# Patient Record
Sex: Female | Born: 1935
Health system: Southern US, Community
[De-identification: ages and names within clinical notes are randomized; demographics above are authoritative.]

---

## 1998-01-19 ENCOUNTER — Inpatient Hospital Stay (HOSPITAL_COMMUNITY): Admission: EM | Admit: 1998-01-19 | Discharge: 1998-01-21 | Payer: Self-pay | Admitting: Emergency Medicine

## 1998-01-21 ENCOUNTER — Inpatient Hospital Stay (HOSPITAL_COMMUNITY): Admission: EM | Admit: 1998-01-21 | Discharge: 1998-01-25 | Payer: Self-pay | Admitting: Emergency Medicine

## 1999-08-21 ENCOUNTER — Other Ambulatory Visit: Admission: RE | Admit: 1999-08-21 | Discharge: 1999-08-21 | Payer: Self-pay | Admitting: *Deleted

## 1999-09-26 ENCOUNTER — Encounter (INDEPENDENT_AMBULATORY_CARE_PROVIDER_SITE_OTHER): Payer: Self-pay

## 1999-09-26 ENCOUNTER — Other Ambulatory Visit: Admission: RE | Admit: 1999-09-26 | Discharge: 1999-09-26 | Payer: Self-pay | Admitting: *Deleted

## 2000-08-18 ENCOUNTER — Other Ambulatory Visit: Admission: RE | Admit: 2000-08-18 | Discharge: 2000-08-18 | Payer: Self-pay | Admitting: *Deleted

## 2002-08-22 ENCOUNTER — Other Ambulatory Visit: Admission: RE | Admit: 2002-08-22 | Discharge: 2002-08-22 | Payer: Self-pay | Admitting: Obstetrics and Gynecology

## 2004-01-25 ENCOUNTER — Other Ambulatory Visit: Admission: RE | Admit: 2004-01-25 | Discharge: 2004-01-25 | Payer: Self-pay | Admitting: Obstetrics and Gynecology

## 2008-03-10 ENCOUNTER — Encounter: Admission: RE | Admit: 2008-03-10 | Discharge: 2008-03-10 | Payer: Self-pay | Admitting: Internal Medicine

## 2012-01-28 DIAGNOSIS — H5710 Ocular pain, unspecified eye: Secondary | ICD-10-CM | POA: Diagnosis not present

## 2012-01-28 DIAGNOSIS — H5789 Other specified disorders of eye and adnexa: Secondary | ICD-10-CM | POA: Diagnosis not present

## 2012-01-28 DIAGNOSIS — H02059 Trichiasis without entropian unspecified eye, unspecified eyelid: Secondary | ICD-10-CM | POA: Diagnosis not present

## 2012-01-28 DIAGNOSIS — H179 Unspecified corneal scar and opacity: Secondary | ICD-10-CM | POA: Diagnosis not present

## 2012-02-11 DIAGNOSIS — Z79899 Other long term (current) drug therapy: Secondary | ICD-10-CM | POA: Diagnosis not present

## 2012-02-11 DIAGNOSIS — M899 Disorder of bone, unspecified: Secondary | ICD-10-CM | POA: Diagnosis not present

## 2012-02-11 DIAGNOSIS — Z0000 Encounter for general adult medical examination without abnormal findings: Secondary | ICD-10-CM | POA: Diagnosis not present

## 2012-02-11 DIAGNOSIS — J329 Chronic sinusitis, unspecified: Secondary | ICD-10-CM | POA: Diagnosis not present

## 2012-02-11 DIAGNOSIS — K5730 Diverticulosis of large intestine without perforation or abscess without bleeding: Secondary | ICD-10-CM | POA: Diagnosis not present

## 2012-02-11 DIAGNOSIS — Z1331 Encounter for screening for depression: Secondary | ICD-10-CM | POA: Diagnosis not present

## 2012-04-07 DIAGNOSIS — R222 Localized swelling, mass and lump, trunk: Secondary | ICD-10-CM | POA: Diagnosis not present

## 2012-04-08 ENCOUNTER — Other Ambulatory Visit: Payer: Self-pay | Admitting: *Deleted

## 2012-04-08 DIAGNOSIS — R911 Solitary pulmonary nodule: Secondary | ICD-10-CM

## 2012-04-13 ENCOUNTER — Ambulatory Visit
Admission: RE | Admit: 2012-04-13 | Discharge: 2012-04-13 | Disposition: A | Discharge status: Home / Self Care | Payer: Medicare Other | Source: Ambulatory Visit | Attending: *Deleted | Admitting: *Deleted

## 2012-04-13 DIAGNOSIS — J984 Other disorders of lung: Secondary | ICD-10-CM | POA: Diagnosis not present

## 2012-04-13 DIAGNOSIS — R911 Solitary pulmonary nodule: Secondary | ICD-10-CM

## 2012-04-14 ENCOUNTER — Other Ambulatory Visit: Payer: Self-pay | Admitting: Internal Medicine

## 2012-04-14 DIAGNOSIS — R918 Other nonspecific abnormal finding of lung field: Secondary | ICD-10-CM

## 2012-05-27 DIAGNOSIS — Z961 Presence of intraocular lens: Secondary | ICD-10-CM | POA: Diagnosis not present

## 2012-05-27 DIAGNOSIS — H5789 Other specified disorders of eye and adnexa: Secondary | ICD-10-CM | POA: Diagnosis not present

## 2012-05-27 DIAGNOSIS — H00039 Abscess of eyelid unspecified eye, unspecified eyelid: Secondary | ICD-10-CM | POA: Diagnosis not present

## 2012-05-27 DIAGNOSIS — H43399 Other vitreous opacities, unspecified eye: Secondary | ICD-10-CM | POA: Diagnosis not present

## 2012-08-05 DIAGNOSIS — H04129 Dry eye syndrome of unspecified lacrimal gland: Secondary | ICD-10-CM | POA: Diagnosis not present

## 2012-08-05 DIAGNOSIS — H43399 Other vitreous opacities, unspecified eye: Secondary | ICD-10-CM | POA: Diagnosis not present

## 2012-08-05 DIAGNOSIS — Z961 Presence of intraocular lens: Secondary | ICD-10-CM | POA: Diagnosis not present

## 2012-09-02 DIAGNOSIS — Z1231 Encounter for screening mammogram for malignant neoplasm of breast: Secondary | ICD-10-CM | POA: Diagnosis not present

## 2012-09-02 DIAGNOSIS — J069 Acute upper respiratory infection, unspecified: Secondary | ICD-10-CM | POA: Diagnosis not present

## 2012-10-11 DIAGNOSIS — M76829 Posterior tibial tendinitis, unspecified leg: Secondary | ICD-10-CM | POA: Diagnosis not present

## 2012-10-11 DIAGNOSIS — M25473 Effusion, unspecified ankle: Secondary | ICD-10-CM | POA: Diagnosis not present

## 2012-10-11 DIAGNOSIS — M25476 Effusion, unspecified foot: Secondary | ICD-10-CM | POA: Diagnosis not present

## 2012-10-11 DIAGNOSIS — M2010 Hallux valgus (acquired), unspecified foot: Secondary | ICD-10-CM | POA: Diagnosis not present

## 2012-10-11 DIAGNOSIS — M79609 Pain in unspecified limb: Secondary | ICD-10-CM | POA: Diagnosis not present

## 2012-10-14 ENCOUNTER — Ambulatory Visit
Admission: RE | Admit: 2012-10-14 | Discharge: 2012-10-14 | Disposition: A | Discharge status: Home / Self Care | Payer: PRIVATE HEALTH INSURANCE | Source: Ambulatory Visit | Attending: Internal Medicine | Admitting: Internal Medicine

## 2012-10-14 DIAGNOSIS — J988 Other specified respiratory disorders: Secondary | ICD-10-CM | POA: Diagnosis not present

## 2012-10-14 DIAGNOSIS — R918 Other nonspecific abnormal finding of lung field: Secondary | ICD-10-CM

## 2012-11-09 DIAGNOSIS — H16149 Punctate keratitis, unspecified eye: Secondary | ICD-10-CM | POA: Diagnosis not present

## 2012-11-09 DIAGNOSIS — T1510XA Foreign body in conjunctival sac, unspecified eye, initial encounter: Secondary | ICD-10-CM | POA: Diagnosis not present

## 2012-12-07 DIAGNOSIS — Q828 Other specified congenital malformations of skin: Secondary | ICD-10-CM | POA: Diagnosis not present

## 2012-12-07 DIAGNOSIS — M79609 Pain in unspecified limb: Secondary | ICD-10-CM | POA: Diagnosis not present

## 2012-12-07 DIAGNOSIS — L923 Foreign body granuloma of the skin and subcutaneous tissue: Secondary | ICD-10-CM | POA: Diagnosis not present

## 2013-02-16 DIAGNOSIS — M899 Disorder of bone, unspecified: Secondary | ICD-10-CM | POA: Diagnosis not present

## 2013-02-16 DIAGNOSIS — R911 Solitary pulmonary nodule: Secondary | ICD-10-CM | POA: Diagnosis not present

## 2013-02-16 DIAGNOSIS — Z1331 Encounter for screening for depression: Secondary | ICD-10-CM | POA: Diagnosis not present

## 2013-02-16 DIAGNOSIS — K5730 Diverticulosis of large intestine without perforation or abscess without bleeding: Secondary | ICD-10-CM | POA: Diagnosis not present

## 2013-02-16 DIAGNOSIS — Z0000 Encounter for general adult medical examination without abnormal findings: Secondary | ICD-10-CM | POA: Diagnosis not present

## 2013-02-16 DIAGNOSIS — M949 Disorder of cartilage, unspecified: Secondary | ICD-10-CM | POA: Diagnosis not present

## 2013-02-16 DIAGNOSIS — Z79899 Other long term (current) drug therapy: Secondary | ICD-10-CM | POA: Diagnosis not present

## 2013-03-30 DIAGNOSIS — L820 Inflamed seborrheic keratosis: Secondary | ICD-10-CM | POA: Diagnosis not present

## 2013-03-30 DIAGNOSIS — L821 Other seborrheic keratosis: Secondary | ICD-10-CM | POA: Diagnosis not present

## 2013-03-30 DIAGNOSIS — D239 Other benign neoplasm of skin, unspecified: Secondary | ICD-10-CM | POA: Diagnosis not present

## 2013-03-30 DIAGNOSIS — L708 Other acne: Secondary | ICD-10-CM | POA: Diagnosis not present

## 2013-07-25 DIAGNOSIS — L723 Sebaceous cyst: Secondary | ICD-10-CM | POA: Diagnosis not present

## 2013-07-25 DIAGNOSIS — I781 Nevus, non-neoplastic: Secondary | ICD-10-CM | POA: Diagnosis not present

## 2013-07-25 DIAGNOSIS — L821 Other seborrheic keratosis: Secondary | ICD-10-CM | POA: Diagnosis not present

## 2013-08-10 DIAGNOSIS — H35369 Drusen (degenerative) of macula, unspecified eye: Secondary | ICD-10-CM | POA: Diagnosis not present

## 2013-08-10 DIAGNOSIS — H43819 Vitreous degeneration, unspecified eye: Secondary | ICD-10-CM | POA: Diagnosis not present

## 2013-08-10 DIAGNOSIS — H35379 Puckering of macula, unspecified eye: Secondary | ICD-10-CM | POA: Diagnosis not present

## 2013-08-10 DIAGNOSIS — H02059 Trichiasis without entropian unspecified eye, unspecified eyelid: Secondary | ICD-10-CM | POA: Diagnosis not present

## 2013-08-10 DIAGNOSIS — H31019 Macula scars of posterior pole (postinflammatory) (post-traumatic), unspecified eye: Secondary | ICD-10-CM | POA: Diagnosis not present

## 2013-08-10 DIAGNOSIS — H10509 Unspecified blepharoconjunctivitis, unspecified eye: Secondary | ICD-10-CM | POA: Diagnosis not present

## 2013-09-01 DIAGNOSIS — Z23 Encounter for immunization: Secondary | ICD-10-CM | POA: Diagnosis not present

## 2013-09-05 DIAGNOSIS — Z1231 Encounter for screening mammogram for malignant neoplasm of breast: Secondary | ICD-10-CM | POA: Diagnosis not present

## 2013-11-17 ENCOUNTER — Other Ambulatory Visit: Payer: Self-pay | Admitting: Internal Medicine

## 2013-11-17 ENCOUNTER — Ambulatory Visit
Admission: RE | Admit: 2013-11-17 | Discharge: 2013-11-17 | Disposition: A | Discharge status: Home / Self Care | Payer: Medicare Other | Source: Ambulatory Visit | Attending: Internal Medicine | Admitting: Internal Medicine

## 2013-11-17 DIAGNOSIS — M79609 Pain in unspecified limb: Secondary | ICD-10-CM

## 2013-11-17 DIAGNOSIS — S8990XA Unspecified injury of unspecified lower leg, initial encounter: Secondary | ICD-10-CM | POA: Diagnosis not present

## 2013-11-17 DIAGNOSIS — S99919A Unspecified injury of unspecified ankle, initial encounter: Secondary | ICD-10-CM | POA: Diagnosis not present

## 2013-11-17 DIAGNOSIS — Z23 Encounter for immunization: Secondary | ICD-10-CM | POA: Diagnosis not present

## 2013-11-29 ENCOUNTER — Ambulatory Visit (INDEPENDENT_AMBULATORY_CARE_PROVIDER_SITE_OTHER): Payer: Medicare Other | Admitting: Podiatry

## 2013-11-29 ENCOUNTER — Encounter: Payer: Self-pay | Admitting: Podiatry

## 2013-11-29 VITALS — BP 119/84 | HR 75

## 2013-11-29 DIAGNOSIS — L923 Foreign body granuloma of the skin and subcutaneous tissue: Secondary | ICD-10-CM | POA: Diagnosis not present

## 2013-11-29 DIAGNOSIS — M79609 Pain in unspecified limb: Secondary | ICD-10-CM

## 2013-11-29 DIAGNOSIS — M79673 Pain in unspecified foot: Secondary | ICD-10-CM | POA: Insufficient documentation

## 2013-11-29 NOTE — Patient Instructions (Signed)
Removed foreign body, small fragment of glass. Return as needed.

## 2013-11-29 NOTE — Progress Notes (Signed)
Subjective: 80 year old female presents stating that she as a piece of glass on her foot after she broke a glass in wooden floor.  She is able to feel it and hurts at times, but could not get it out. She was seen by her PCP and x-rays were taken.   Objective: Black spot of lesion at ball of right foot. No associated edema or erythema noted.   Assessment: Foreign body right foot.  Plan: Area cleansed with Betadine solution and foreign body, a small fragment of glass removed. Area covered with Band aid.

## 2014-02-14 ENCOUNTER — Ambulatory Visit (INDEPENDENT_AMBULATORY_CARE_PROVIDER_SITE_OTHER): Payer: Medicare Other | Admitting: Podiatry

## 2014-02-14 ENCOUNTER — Encounter: Payer: Self-pay | Admitting: Podiatry

## 2014-02-14 VITALS — BP 132/80 | HR 71

## 2014-02-14 DIAGNOSIS — I83219 Varicose veins of right lower extremity with both ulcer of unspecified site and inflammation: Secondary | ICD-10-CM | POA: Diagnosis not present

## 2014-02-14 DIAGNOSIS — I83229 Varicose veins of left lower extremity with both ulcer of unspecified site and inflammation: Secondary | ICD-10-CM

## 2014-02-14 DIAGNOSIS — L97919 Non-pressure chronic ulcer of unspecified part of right lower leg with unspecified severity: Secondary | ICD-10-CM | POA: Diagnosis not present

## 2014-02-14 DIAGNOSIS — M79673 Pain in unspecified foot: Secondary | ICD-10-CM

## 2014-02-14 DIAGNOSIS — M79609 Pain in unspecified limb: Secondary | ICD-10-CM

## 2014-02-14 DIAGNOSIS — L97929 Non-pressure chronic ulcer of unspecified part of left lower leg with unspecified severity: Secondary | ICD-10-CM

## 2014-02-14 NOTE — Progress Notes (Signed)
Subjective: 80 year old female presents with painful area left foot dorsum, which had been hit with hard object about 3 weeks ago.  She noted of purple color and swelling.   Objective: Purple discoloration along dorsal branch of venous vein with peripheral swelling, which is  localized over 2nd and 3rd Metatarsal shaft area left foot with pain. No extended edema or erythema or color change.   Assessment: Ecchymosis following injury with enlarged venous vein dorsum of left foot.  Plan: Discussed findings. Compression stockinet dispensed with instruction.  Return as needed.

## 2014-02-14 NOTE — Patient Instructions (Signed)
Seen for pain in left foot. Need compression stockinet to protect bulged out vein. Return as needed.

## 2014-02-28 DIAGNOSIS — M899 Disorder of bone, unspecified: Secondary | ICD-10-CM | POA: Diagnosis not present

## 2014-02-28 DIAGNOSIS — Z79899 Other long term (current) drug therapy: Secondary | ICD-10-CM | POA: Diagnosis not present

## 2014-02-28 DIAGNOSIS — R911 Solitary pulmonary nodule: Secondary | ICD-10-CM | POA: Diagnosis not present

## 2014-02-28 DIAGNOSIS — Z0000 Encounter for general adult medical examination without abnormal findings: Secondary | ICD-10-CM | POA: Diagnosis not present

## 2014-02-28 DIAGNOSIS — H6120 Impacted cerumen, unspecified ear: Secondary | ICD-10-CM | POA: Diagnosis not present

## 2014-02-28 DIAGNOSIS — E782 Mixed hyperlipidemia: Secondary | ICD-10-CM | POA: Diagnosis not present

## 2014-02-28 DIAGNOSIS — Z1331 Encounter for screening for depression: Secondary | ICD-10-CM | POA: Diagnosis not present

## 2014-02-28 DIAGNOSIS — K5730 Diverticulosis of large intestine without perforation or abscess without bleeding: Secondary | ICD-10-CM | POA: Diagnosis not present

## 2014-02-28 DIAGNOSIS — M6530 Trigger finger, unspecified finger: Secondary | ICD-10-CM | POA: Diagnosis not present

## 2014-03-12 DIAGNOSIS — W108XXA Fall (on) (from) other stairs and steps, initial encounter: Secondary | ICD-10-CM | POA: Diagnosis not present

## 2014-03-12 DIAGNOSIS — T148XXA Other injury of unspecified body region, initial encounter: Secondary | ICD-10-CM | POA: Diagnosis not present

## 2014-03-12 DIAGNOSIS — IMO0002 Reserved for concepts with insufficient information to code with codable children: Secondary | ICD-10-CM | POA: Diagnosis not present

## 2014-03-15 DIAGNOSIS — M6530 Trigger finger, unspecified finger: Secondary | ICD-10-CM | POA: Diagnosis not present

## 2014-04-12 DIAGNOSIS — M6530 Trigger finger, unspecified finger: Secondary | ICD-10-CM | POA: Diagnosis not present

## 2014-06-21 DIAGNOSIS — M6530 Trigger finger, unspecified finger: Secondary | ICD-10-CM | POA: Diagnosis not present

## 2014-09-06 DIAGNOSIS — Z1231 Encounter for screening mammogram for malignant neoplasm of breast: Secondary | ICD-10-CM | POA: Diagnosis not present

## 2014-09-06 DIAGNOSIS — M8580 Other specified disorders of bone density and structure, unspecified site: Secondary | ICD-10-CM | POA: Diagnosis not present

## 2014-09-13 DIAGNOSIS — Z23 Encounter for immunization: Secondary | ICD-10-CM | POA: Diagnosis not present

## 2014-10-16 DIAGNOSIS — M79673 Pain in unspecified foot: Secondary | ICD-10-CM

## 2014-11-27 DIAGNOSIS — M65332 Trigger finger, left middle finger: Secondary | ICD-10-CM | POA: Diagnosis not present

## 2014-12-04 DIAGNOSIS — Z4789 Encounter for other orthopedic aftercare: Secondary | ICD-10-CM | POA: Diagnosis not present

## 2014-12-08 DIAGNOSIS — M65332 Trigger finger, left middle finger: Secondary | ICD-10-CM | POA: Diagnosis not present

## 2014-12-11 DIAGNOSIS — M65332 Trigger finger, left middle finger: Secondary | ICD-10-CM | POA: Diagnosis not present

## 2014-12-18 DIAGNOSIS — M65332 Trigger finger, left middle finger: Secondary | ICD-10-CM | POA: Diagnosis not present

## 2014-12-21 DIAGNOSIS — Z4789 Encounter for other orthopedic aftercare: Secondary | ICD-10-CM | POA: Diagnosis not present

## 2014-12-26 DIAGNOSIS — M65332 Trigger finger, left middle finger: Secondary | ICD-10-CM | POA: Diagnosis not present

## 2015-01-01 DIAGNOSIS — M65332 Trigger finger, left middle finger: Secondary | ICD-10-CM | POA: Diagnosis not present

## 2015-01-16 DIAGNOSIS — M65332 Trigger finger, left middle finger: Secondary | ICD-10-CM | POA: Diagnosis not present

## 2015-01-30 DIAGNOSIS — M65332 Trigger finger, left middle finger: Secondary | ICD-10-CM | POA: Diagnosis not present

## 2015-03-08 DIAGNOSIS — Z4789 Encounter for other orthopedic aftercare: Secondary | ICD-10-CM | POA: Diagnosis not present

## 2015-03-08 DIAGNOSIS — M67431 Ganglion, right wrist: Secondary | ICD-10-CM | POA: Diagnosis not present

## 2015-03-17 DIAGNOSIS — M25531 Pain in right wrist: Secondary | ICD-10-CM | POA: Diagnosis not present

## 2015-03-20 DIAGNOSIS — J329 Chronic sinusitis, unspecified: Secondary | ICD-10-CM | POA: Diagnosis not present

## 2015-03-20 DIAGNOSIS — Z79899 Other long term (current) drug therapy: Secondary | ICD-10-CM | POA: Diagnosis not present

## 2015-03-20 DIAGNOSIS — M1990 Unspecified osteoarthritis, unspecified site: Secondary | ICD-10-CM | POA: Diagnosis not present

## 2015-03-20 DIAGNOSIS — K5730 Diverticulosis of large intestine without perforation or abscess without bleeding: Secondary | ICD-10-CM | POA: Diagnosis not present

## 2015-03-20 DIAGNOSIS — Z1389 Encounter for screening for other disorder: Secondary | ICD-10-CM | POA: Diagnosis not present

## 2015-03-20 DIAGNOSIS — M859 Disorder of bone density and structure, unspecified: Secondary | ICD-10-CM | POA: Diagnosis not present

## 2015-03-20 DIAGNOSIS — E782 Mixed hyperlipidemia: Secondary | ICD-10-CM | POA: Diagnosis not present

## 2015-03-20 DIAGNOSIS — Z0001 Encounter for general adult medical examination with abnormal findings: Secondary | ICD-10-CM | POA: Diagnosis not present

## 2015-03-23 DIAGNOSIS — M67431 Ganglion, right wrist: Secondary | ICD-10-CM | POA: Diagnosis not present

## 2015-06-13 DIAGNOSIS — Z23 Encounter for immunization: Secondary | ICD-10-CM | POA: Diagnosis not present

## 2015-06-13 DIAGNOSIS — D1800 Hemangioma unspecified site: Secondary | ICD-10-CM | POA: Diagnosis not present

## 2015-06-13 DIAGNOSIS — L219 Seborrheic dermatitis, unspecified: Secondary | ICD-10-CM | POA: Diagnosis not present

## 2015-06-13 DIAGNOSIS — L821 Other seborrheic keratosis: Secondary | ICD-10-CM | POA: Diagnosis not present

## 2015-06-13 DIAGNOSIS — L814 Other melanin hyperpigmentation: Secondary | ICD-10-CM | POA: Diagnosis not present

## 2015-08-22 DIAGNOSIS — H35363 Drusen (degenerative) of macula, bilateral: Secondary | ICD-10-CM | POA: Diagnosis not present

## 2015-08-22 DIAGNOSIS — H43813 Vitreous degeneration, bilateral: Secondary | ICD-10-CM | POA: Diagnosis not present

## 2015-08-22 DIAGNOSIS — H01003 Unspecified blepharitis right eye, unspecified eyelid: Secondary | ICD-10-CM | POA: Diagnosis not present

## 2015-08-22 DIAGNOSIS — H1859 Other hereditary corneal dystrophies: Secondary | ICD-10-CM | POA: Diagnosis not present

## 2015-08-22 DIAGNOSIS — H35371 Puckering of macula, right eye: Secondary | ICD-10-CM | POA: Diagnosis not present

## 2015-09-11 DIAGNOSIS — Z1231 Encounter for screening mammogram for malignant neoplasm of breast: Secondary | ICD-10-CM | POA: Diagnosis not present

## 2015-12-25 ENCOUNTER — Encounter: Payer: Self-pay | Admitting: Podiatry

## 2015-12-25 ENCOUNTER — Ambulatory Visit (INDEPENDENT_AMBULATORY_CARE_PROVIDER_SITE_OTHER): Payer: Medicare Other | Admitting: Podiatry

## 2015-12-25 VITALS — BP 119/78 | HR 74

## 2015-12-25 DIAGNOSIS — R262 Difficulty in walking, not elsewhere classified: Secondary | ICD-10-CM | POA: Diagnosis not present

## 2015-12-25 DIAGNOSIS — M7661 Achilles tendinitis, right leg: Secondary | ICD-10-CM | POA: Diagnosis not present

## 2015-12-25 MED ORDER — DICLOFENAC SODIUM 1 % TD GEL: 2.0000 g | Freq: Four times a day (QID) | TRANSDERMAL | Status: AC

## 2015-12-25 NOTE — Progress Notes (Signed)
Subjective: 82 year old female presents with pain in right heel. Yesterday morning she woke up with pain in the back of left heel. Hurts if extend the heel. Pain is not going away.  She was in a weekend activity that required much of standing and walking.  Pain gets little better through the day.  Has had history of tendonitis on left.  Objective: No edema or erythema noted on affected area left heel. Pain with light pressure to the posterior inferior aspect left heel at about Achilles tendon attachment site. No open skin lesions noted. All pedal pulses are palpable.  Severe hallux valgus with bunion bilateral. Normal range of motion at ankle, DF and PF bilateral.  Assessment: Achilles tendonitis right.  Plan: Discussed findings and available treatment options. Patient is to limit strenuous exercise. Dispensed Night splint with instruction. Voltaren gel prescribed.

## 2015-12-25 NOTE — Patient Instructions (Signed)
Seen for pain in right posterior heel. Possible Achilles tendonitis. Use Night Splint at night. Use Voltaren gel as needed. Return in 2 weeks.

## 2016-01-08 ENCOUNTER — Ambulatory Visit (INDEPENDENT_AMBULATORY_CARE_PROVIDER_SITE_OTHER): Payer: Medicare Other | Admitting: Podiatry

## 2016-01-08 ENCOUNTER — Encounter: Payer: Self-pay | Admitting: Podiatry

## 2016-01-08 VITALS — BP 118/72 | HR 77

## 2016-01-08 DIAGNOSIS — R262 Difficulty in walking, not elsewhere classified: Secondary | ICD-10-CM

## 2016-01-08 DIAGNOSIS — M7661 Achilles tendinitis, right leg: Secondary | ICD-10-CM

## 2016-01-08 NOTE — Progress Notes (Signed)
Subjective: 82 year old female presents for follow up on left heel pain. Been using night splint and Voltaren gel as prescribed.   Objective: No further discomfort on left heel pain.  All pedal pulses are palpable.  Severe hallux valgus with bunion bilateral. Normal range of motion at ankle, DF and PF bilateral.  Assessment: Achilles tendonitis left imprved.  Plan: Continue to avoid flat shoes or barefoot. Return as needed.

## 2016-01-08 NOTE — Patient Instructions (Addendum)
Left heel pain improved with night splint and Voltaren gel. Continue to avoid flat shoes or barefooted. Return as needed.

## 2016-01-21 DIAGNOSIS — Z4789 Encounter for other orthopedic aftercare: Secondary | ICD-10-CM | POA: Diagnosis not present

## 2016-01-21 DIAGNOSIS — M67431 Ganglion, right wrist: Secondary | ICD-10-CM | POA: Diagnosis not present

## 2016-01-21 DIAGNOSIS — M720 Palmar fascial fibromatosis [Dupuytren]: Secondary | ICD-10-CM | POA: Diagnosis not present

## 2016-03-20 DIAGNOSIS — B079 Viral wart, unspecified: Secondary | ICD-10-CM | POA: Diagnosis not present

## 2016-03-20 DIAGNOSIS — D485 Neoplasm of uncertain behavior of skin: Secondary | ICD-10-CM | POA: Diagnosis not present

## 2016-03-28 DIAGNOSIS — M1990 Unspecified osteoarthritis, unspecified site: Secondary | ICD-10-CM | POA: Diagnosis not present

## 2016-03-28 DIAGNOSIS — M859 Disorder of bone density and structure, unspecified: Secondary | ICD-10-CM | POA: Diagnosis not present

## 2016-03-28 DIAGNOSIS — E782 Mixed hyperlipidemia: Secondary | ICD-10-CM | POA: Diagnosis not present

## 2016-03-28 DIAGNOSIS — M6530 Trigger finger, unspecified finger: Secondary | ICD-10-CM | POA: Diagnosis not present

## 2016-03-28 DIAGNOSIS — K5730 Diverticulosis of large intestine without perforation or abscess without bleeding: Secondary | ICD-10-CM | POA: Diagnosis not present

## 2016-03-28 DIAGNOSIS — Z1389 Encounter for screening for other disorder: Secondary | ICD-10-CM | POA: Diagnosis not present

## 2016-03-28 DIAGNOSIS — Z79899 Other long term (current) drug therapy: Secondary | ICD-10-CM | POA: Diagnosis not present

## 2016-03-28 DIAGNOSIS — Z23 Encounter for immunization: Secondary | ICD-10-CM | POA: Diagnosis not present

## 2016-03-28 DIAGNOSIS — J329 Chronic sinusitis, unspecified: Secondary | ICD-10-CM | POA: Diagnosis not present

## 2016-03-28 DIAGNOSIS — H6123 Impacted cerumen, bilateral: Secondary | ICD-10-CM | POA: Diagnosis not present

## 2016-07-30 DIAGNOSIS — I889 Nonspecific lymphadenitis, unspecified: Secondary | ICD-10-CM | POA: Diagnosis not present

## 2016-09-17 DIAGNOSIS — Z1231 Encounter for screening mammogram for malignant neoplasm of breast: Secondary | ICD-10-CM | POA: Diagnosis not present

## 2016-09-17 DIAGNOSIS — M810 Age-related osteoporosis without current pathological fracture: Secondary | ICD-10-CM | POA: Diagnosis not present

## 2016-09-17 DIAGNOSIS — M85851 Other specified disorders of bone density and structure, right thigh: Secondary | ICD-10-CM | POA: Diagnosis not present

## 2016-10-02 DIAGNOSIS — Z961 Presence of intraocular lens: Secondary | ICD-10-CM | POA: Diagnosis not present

## 2016-10-02 DIAGNOSIS — H18413 Arcus senilis, bilateral: Secondary | ICD-10-CM | POA: Diagnosis not present

## 2016-10-28 DIAGNOSIS — M810 Age-related osteoporosis without current pathological fracture: Secondary | ICD-10-CM | POA: Diagnosis not present

## 2016-11-05 DIAGNOSIS — Z23 Encounter for immunization: Secondary | ICD-10-CM | POA: Diagnosis not present

## 2017-01-28 DIAGNOSIS — D1800 Hemangioma unspecified site: Secondary | ICD-10-CM | POA: Diagnosis not present

## 2017-01-28 DIAGNOSIS — D225 Melanocytic nevi of trunk: Secondary | ICD-10-CM | POA: Diagnosis not present

## 2017-01-28 DIAGNOSIS — L814 Other melanin hyperpigmentation: Secondary | ICD-10-CM | POA: Diagnosis not present

## 2017-01-28 DIAGNOSIS — L821 Other seborrheic keratosis: Secondary | ICD-10-CM | POA: Diagnosis not present

## 2017-03-10 DIAGNOSIS — Z0000 Encounter for general adult medical examination without abnormal findings: Secondary | ICD-10-CM | POA: Diagnosis not present

## 2017-05-12 DIAGNOSIS — M6530 Trigger finger, unspecified finger: Secondary | ICD-10-CM | POA: Diagnosis not present

## 2017-05-12 DIAGNOSIS — E782 Mixed hyperlipidemia: Secondary | ICD-10-CM | POA: Diagnosis not present

## 2017-05-12 DIAGNOSIS — Z1389 Encounter for screening for other disorder: Secondary | ICD-10-CM | POA: Diagnosis not present

## 2017-05-12 DIAGNOSIS — Z0001 Encounter for general adult medical examination with abnormal findings: Secondary | ICD-10-CM | POA: Diagnosis not present

## 2017-05-12 DIAGNOSIS — Z79899 Other long term (current) drug therapy: Secondary | ICD-10-CM | POA: Diagnosis not present

## 2017-05-12 DIAGNOSIS — K5730 Diverticulosis of large intestine without perforation or abscess without bleeding: Secondary | ICD-10-CM | POA: Diagnosis not present

## 2017-05-12 DIAGNOSIS — M810 Age-related osteoporosis without current pathological fracture: Secondary | ICD-10-CM | POA: Diagnosis not present

## 2017-05-12 DIAGNOSIS — J329 Chronic sinusitis, unspecified: Secondary | ICD-10-CM | POA: Diagnosis not present

## 2017-05-13 DIAGNOSIS — H524 Presbyopia: Secondary | ICD-10-CM | POA: Diagnosis not present

## 2017-05-13 DIAGNOSIS — Z961 Presence of intraocular lens: Secondary | ICD-10-CM | POA: Diagnosis not present

## 2017-07-12 DIAGNOSIS — Z23 Encounter for immunization: Secondary | ICD-10-CM | POA: Diagnosis not present

## 2017-10-08 DIAGNOSIS — Z1231 Encounter for screening mammogram for malignant neoplasm of breast: Secondary | ICD-10-CM | POA: Diagnosis not present

## 2017-11-04 DIAGNOSIS — Z23 Encounter for immunization: Secondary | ICD-10-CM | POA: Diagnosis not present

## 2017-11-04 DIAGNOSIS — L814 Other melanin hyperpigmentation: Secondary | ICD-10-CM | POA: Diagnosis not present

## 2017-11-04 DIAGNOSIS — L820 Inflamed seborrheic keratosis: Secondary | ICD-10-CM | POA: Diagnosis not present

## 2017-11-04 DIAGNOSIS — L309 Dermatitis, unspecified: Secondary | ICD-10-CM | POA: Diagnosis not present

## 2017-11-04 DIAGNOSIS — D225 Melanocytic nevi of trunk: Secondary | ICD-10-CM | POA: Diagnosis not present

## 2017-11-04 DIAGNOSIS — L821 Other seborrheic keratosis: Secondary | ICD-10-CM | POA: Diagnosis not present

## 2017-11-04 DIAGNOSIS — D1800 Hemangioma unspecified site: Secondary | ICD-10-CM | POA: Diagnosis not present

## 2017-11-04 DIAGNOSIS — L700 Acne vulgaris: Secondary | ICD-10-CM | POA: Diagnosis not present

## 2018-01-04 DIAGNOSIS — M791 Myalgia, unspecified site: Secondary | ICD-10-CM | POA: Diagnosis not present

## 2018-05-17 DIAGNOSIS — H5203 Hypermetropia, bilateral: Secondary | ICD-10-CM | POA: Diagnosis not present

## 2018-05-17 DIAGNOSIS — Z961 Presence of intraocular lens: Secondary | ICD-10-CM | POA: Diagnosis not present

## 2018-05-17 DIAGNOSIS — H52201 Unspecified astigmatism, right eye: Secondary | ICD-10-CM | POA: Diagnosis not present

## 2018-05-18 DIAGNOSIS — E559 Vitamin D deficiency, unspecified: Secondary | ICD-10-CM | POA: Diagnosis not present

## 2018-05-18 DIAGNOSIS — Z0000 Encounter for general adult medical examination without abnormal findings: Secondary | ICD-10-CM | POA: Diagnosis not present

## 2018-05-18 DIAGNOSIS — M810 Age-related osteoporosis without current pathological fracture: Secondary | ICD-10-CM | POA: Diagnosis not present

## 2018-05-18 DIAGNOSIS — E782 Mixed hyperlipidemia: Secondary | ICD-10-CM | POA: Diagnosis not present

## 2018-05-18 DIAGNOSIS — K5730 Diverticulosis of large intestine without perforation or abscess without bleeding: Secondary | ICD-10-CM | POA: Diagnosis not present

## 2018-05-18 DIAGNOSIS — Z1389 Encounter for screening for other disorder: Secondary | ICD-10-CM | POA: Diagnosis not present

## 2018-05-24 DIAGNOSIS — N898 Other specified noninflammatory disorders of vagina: Secondary | ICD-10-CM | POA: Diagnosis not present

## 2018-06-04 DIAGNOSIS — L309 Dermatitis, unspecified: Secondary | ICD-10-CM | POA: Diagnosis not present

## 2018-06-04 DIAGNOSIS — N952 Postmenopausal atrophic vaginitis: Secondary | ICD-10-CM | POA: Diagnosis not present

## 2018-07-08 DIAGNOSIS — L309 Dermatitis, unspecified: Secondary | ICD-10-CM | POA: Diagnosis not present

## 2018-07-23 DIAGNOSIS — R944 Abnormal results of kidney function studies: Secondary | ICD-10-CM | POA: Diagnosis not present

## 2018-10-11 DIAGNOSIS — Z8262 Family history of osteoporosis: Secondary | ICD-10-CM | POA: Diagnosis not present

## 2018-10-11 DIAGNOSIS — Z1231 Encounter for screening mammogram for malignant neoplasm of breast: Secondary | ICD-10-CM | POA: Diagnosis not present

## 2018-10-11 DIAGNOSIS — M810 Age-related osteoporosis without current pathological fracture: Secondary | ICD-10-CM | POA: Diagnosis not present

## 2019-04-05 DIAGNOSIS — L820 Inflamed seborrheic keratosis: Secondary | ICD-10-CM | POA: Diagnosis not present

## 2019-04-05 DIAGNOSIS — D485 Neoplasm of uncertain behavior of skin: Secondary | ICD-10-CM | POA: Diagnosis not present

## 2019-04-05 DIAGNOSIS — L814 Other melanin hyperpigmentation: Secondary | ICD-10-CM | POA: Diagnosis not present

## 2019-04-05 DIAGNOSIS — L821 Other seborrheic keratosis: Secondary | ICD-10-CM | POA: Diagnosis not present

## 2019-04-05 DIAGNOSIS — D225 Melanocytic nevi of trunk: Secondary | ICD-10-CM | POA: Diagnosis not present

## 2019-05-23 DIAGNOSIS — Z961 Presence of intraocular lens: Secondary | ICD-10-CM | POA: Diagnosis not present

## 2019-05-23 DIAGNOSIS — Z0100 Encounter for examination of eyes and vision without abnormal findings: Secondary | ICD-10-CM | POA: Diagnosis not present

## 2019-05-24 DIAGNOSIS — D485 Neoplasm of uncertain behavior of skin: Secondary | ICD-10-CM | POA: Diagnosis not present

## 2019-05-24 DIAGNOSIS — C44629 Squamous cell carcinoma of skin of left upper limb, including shoulder: Secondary | ICD-10-CM | POA: Diagnosis not present

## 2019-06-16 DIAGNOSIS — C44629 Squamous cell carcinoma of skin of left upper limb, including shoulder: Secondary | ICD-10-CM | POA: Diagnosis not present

## 2019-06-16 DIAGNOSIS — L905 Scar conditions and fibrosis of skin: Secondary | ICD-10-CM | POA: Diagnosis not present

## 2019-07-04 DIAGNOSIS — M79642 Pain in left hand: Secondary | ICD-10-CM | POA: Diagnosis not present

## 2019-07-20 DIAGNOSIS — M79672 Pain in left foot: Secondary | ICD-10-CM | POA: Diagnosis not present

## 2019-07-20 DIAGNOSIS — M7662 Achilles tendinitis, left leg: Secondary | ICD-10-CM | POA: Diagnosis not present

## 2019-07-20 DIAGNOSIS — M7732 Calcaneal spur, left foot: Secondary | ICD-10-CM | POA: Diagnosis not present

## 2019-07-20 DIAGNOSIS — M722 Plantar fascial fibromatosis: Secondary | ICD-10-CM | POA: Diagnosis not present

## 2019-07-27 DIAGNOSIS — D225 Melanocytic nevi of trunk: Secondary | ICD-10-CM | POA: Diagnosis not present

## 2019-07-27 DIAGNOSIS — D485 Neoplasm of uncertain behavior of skin: Secondary | ICD-10-CM | POA: Diagnosis not present

## 2019-07-27 DIAGNOSIS — Z85828 Personal history of other malignant neoplasm of skin: Secondary | ICD-10-CM | POA: Diagnosis not present

## 2019-07-27 DIAGNOSIS — L814 Other melanin hyperpigmentation: Secondary | ICD-10-CM | POA: Diagnosis not present

## 2019-07-27 DIAGNOSIS — L821 Other seborrheic keratosis: Secondary | ICD-10-CM | POA: Diagnosis not present

## 2019-07-27 DIAGNOSIS — D2261 Melanocytic nevi of right upper limb, including shoulder: Secondary | ICD-10-CM | POA: Diagnosis not present

## 2019-08-03 DIAGNOSIS — H6123 Impacted cerumen, bilateral: Secondary | ICD-10-CM | POA: Diagnosis not present

## 2019-08-03 DIAGNOSIS — Z0001 Encounter for general adult medical examination with abnormal findings: Secondary | ICD-10-CM | POA: Diagnosis not present

## 2019-08-03 DIAGNOSIS — E782 Mixed hyperlipidemia: Secondary | ICD-10-CM | POA: Diagnosis not present

## 2019-08-03 DIAGNOSIS — M810 Age-related osteoporosis without current pathological fracture: Secondary | ICD-10-CM | POA: Diagnosis not present

## 2019-08-03 DIAGNOSIS — Z1389 Encounter for screening for other disorder: Secondary | ICD-10-CM | POA: Diagnosis not present

## 2019-08-03 DIAGNOSIS — K5730 Diverticulosis of large intestine without perforation or abscess without bleeding: Secondary | ICD-10-CM | POA: Diagnosis not present

## 2019-08-03 DIAGNOSIS — E559 Vitamin D deficiency, unspecified: Secondary | ICD-10-CM | POA: Diagnosis not present

## 2019-08-03 DIAGNOSIS — Z23 Encounter for immunization: Secondary | ICD-10-CM | POA: Diagnosis not present

## 2019-08-05 DIAGNOSIS — C44319 Basal cell carcinoma of skin of other parts of face: Secondary | ICD-10-CM | POA: Diagnosis not present

## 2019-08-05 DIAGNOSIS — D234 Other benign neoplasm of skin of scalp and neck: Secondary | ICD-10-CM | POA: Diagnosis not present

## 2019-08-15 DIAGNOSIS — M79642 Pain in left hand: Secondary | ICD-10-CM | POA: Diagnosis not present

## 2019-08-15 DIAGNOSIS — S66812D Strain of other specified muscles, fascia and tendons at wrist and hand level, left hand, subsequent encounter: Secondary | ICD-10-CM | POA: Diagnosis not present

## 2019-10-21 DIAGNOSIS — K111 Hypertrophy of salivary gland: Secondary | ICD-10-CM | POA: Diagnosis not present

## 2019-10-21 DIAGNOSIS — R439 Unspecified disturbances of smell and taste: Secondary | ICD-10-CM | POA: Diagnosis not present

## 2019-10-21 DIAGNOSIS — Z79899 Other long term (current) drug therapy: Secondary | ICD-10-CM | POA: Diagnosis not present

## 2019-10-21 DIAGNOSIS — R432 Parageusia: Secondary | ICD-10-CM | POA: Diagnosis not present

## 2019-11-08 ENCOUNTER — Ambulatory Visit: Payer: Medicare Other | Attending: Internal Medicine

## 2019-11-08 DIAGNOSIS — Z23 Encounter for immunization: Secondary | ICD-10-CM | POA: Insufficient documentation

## 2019-11-08 NOTE — Progress Notes (Signed)
   Covid-19 Vaccination Clinic  Name:  Jill Patterson    MRN: [id number] DOB: 1932/03/23  11/08/2019  [name] was observed post Covid-19 immunization for 15 minutes without incidence. She was provided with Vaccine Information Sheet and instruction to access the V-Safe system.   [name] was instructed to call 911 with any severe reactions post vaccine: Marland Kitchen Difficulty breathing  . Swelling of your face and throat  . A fast heartbeat  . A bad rash all over your body  . Dizziness and weakness    Immunizations Administered    Name Date Dose VIS Date Route   Pfizer COVID-19 Vaccine 11/08/2019  2:10 PM 0.3 mL 09/09/2019 Intramuscular   Manufacturer: [place]   Lot: [id number]   [place]: [id number]

## 2019-11-10 ENCOUNTER — Ambulatory Visit: Payer: Self-pay

## 2019-11-22 ENCOUNTER — Ambulatory Visit
Admission: RE | Admit: 2019-11-22 | Discharge: 2019-11-22 | Disposition: A | Discharge status: Home / Self Care | Payer: Medicare Other | Source: Ambulatory Visit | Attending: Internal Medicine | Admitting: Internal Medicine

## 2019-11-22 ENCOUNTER — Other Ambulatory Visit: Payer: Self-pay | Admitting: Internal Medicine

## 2019-11-22 DIAGNOSIS — F411 Generalized anxiety disorder: Secondary | ICD-10-CM | POA: Diagnosis not present

## 2019-11-22 DIAGNOSIS — R432 Parageusia: Secondary | ICD-10-CM | POA: Diagnosis not present

## 2019-11-22 DIAGNOSIS — K111 Hypertrophy of salivary gland: Secondary | ICD-10-CM

## 2019-11-22 DIAGNOSIS — R439 Unspecified disturbances of smell and taste: Secondary | ICD-10-CM | POA: Diagnosis not present

## 2019-11-22 DIAGNOSIS — R911 Solitary pulmonary nodule: Secondary | ICD-10-CM | POA: Diagnosis not present

## 2019-11-22 DIAGNOSIS — M720 Palmar fascial fibromatosis [Dupuytren]: Secondary | ICD-10-CM | POA: Diagnosis not present

## 2019-11-22 DIAGNOSIS — Z0183 Encounter for blood typing: Secondary | ICD-10-CM | POA: Diagnosis not present

## 2019-11-29 DIAGNOSIS — R221 Localized swelling, mass and lump, neck: Secondary | ICD-10-CM | POA: Diagnosis not present

## 2019-11-29 DIAGNOSIS — C770 Secondary and unspecified malignant neoplasm of lymph nodes of head, face and neck: Secondary | ICD-10-CM | POA: Diagnosis not present

## 2019-12-03 ENCOUNTER — Ambulatory Visit: Payer: Medicare Other | Attending: Internal Medicine

## 2019-12-03 DIAGNOSIS — Z23 Encounter for immunization: Secondary | ICD-10-CM | POA: Insufficient documentation

## 2019-12-03 NOTE — Progress Notes (Signed)
   Covid-19 Vaccination Clinic  Name:  JOSLIN BEATY    MRN: [id number] DOB: [birth year]  12/03/2019  Ms. Luse was observed post Covid-19 immunization for 15 minutes without incident. She was provided with Vaccine Information Sheet and instruction to access the V-Safe system.   Ms. Naseem was instructed to call 911 with any severe reactions post vaccine: Marland Kitchen Difficulty breathing  . Swelling of face and throat  . A fast heartbeat  . A bad rash all over body  . Dizziness and weakness   Immunizations Administered    Name Date Dose VIS Date Route   Pfizer COVID-19 Vaccine 12/03/2019  9:37 AM 0.3 mL 09/09/2019 Intramuscular   Manufacturer: [place]   Lot: [id number]   [place]: [id number]

## 2019-12-05 DIAGNOSIS — M79641 Pain in right hand: Secondary | ICD-10-CM | POA: Diagnosis not present

## 2019-12-05 DIAGNOSIS — S63003A Unspecified subluxation of unspecified wrist and hand, initial encounter: Secondary | ICD-10-CM | POA: Diagnosis not present

## 2019-12-05 DIAGNOSIS — M720 Palmar fascial fibromatosis [Dupuytren]: Secondary | ICD-10-CM | POA: Diagnosis not present

## 2019-12-05 DIAGNOSIS — M13849 Other specified arthritis, unspecified hand: Secondary | ICD-10-CM | POA: Diagnosis not present

## 2019-12-05 DIAGNOSIS — M67441 Ganglion, right hand: Secondary | ICD-10-CM | POA: Diagnosis not present

## 2019-12-05 DIAGNOSIS — M79642 Pain in left hand: Secondary | ICD-10-CM | POA: Diagnosis not present

## 2019-12-15 DIAGNOSIS — L720 Epidermal cyst: Secondary | ICD-10-CM | POA: Diagnosis not present

## 2019-12-15 DIAGNOSIS — L820 Inflamed seborrheic keratosis: Secondary | ICD-10-CM | POA: Diagnosis not present

## 2019-12-15 DIAGNOSIS — Z85828 Personal history of other malignant neoplasm of skin: Secondary | ICD-10-CM | POA: Diagnosis not present

## 2019-12-19 DIAGNOSIS — Z1231 Encounter for screening mammogram for malignant neoplasm of breast: Secondary | ICD-10-CM | POA: Diagnosis not present

## 2020-01-18 DIAGNOSIS — Z0183 Encounter for blood typing: Secondary | ICD-10-CM | POA: Diagnosis not present

## 2020-01-18 DIAGNOSIS — K111 Hypertrophy of salivary gland: Secondary | ICD-10-CM | POA: Diagnosis not present

## 2020-01-18 DIAGNOSIS — R439 Unspecified disturbances of smell and taste: Secondary | ICD-10-CM | POA: Diagnosis not present

## 2020-01-18 DIAGNOSIS — M720 Palmar fascial fibromatosis [Dupuytren]: Secondary | ICD-10-CM | POA: Diagnosis not present

## 2020-01-18 DIAGNOSIS — R432 Parageusia: Secondary | ICD-10-CM | POA: Diagnosis not present

## 2020-01-18 DIAGNOSIS — Z23 Encounter for immunization: Secondary | ICD-10-CM | POA: Diagnosis not present

## 2020-01-18 DIAGNOSIS — F411 Generalized anxiety disorder: Secondary | ICD-10-CM | POA: Diagnosis not present

## 2020-01-30 DIAGNOSIS — Z4789 Encounter for other orthopedic aftercare: Secondary | ICD-10-CM | POA: Diagnosis not present

## 2020-01-30 DIAGNOSIS — M79641 Pain in right hand: Secondary | ICD-10-CM | POA: Diagnosis not present

## 2020-05-28 DIAGNOSIS — Z961 Presence of intraocular lens: Secondary | ICD-10-CM | POA: Diagnosis not present

## 2020-05-29 ENCOUNTER — Ambulatory Visit: Payer: Medicare Other | Attending: Internal Medicine

## 2020-05-29 DIAGNOSIS — Z23 Encounter for immunization: Secondary | ICD-10-CM

## 2020-05-29 NOTE — Progress Notes (Signed)
   Covid-19 Vaccination Clinic  Name:  WALKER PADDACK    MRN: [id number] DOB: 1932-05-09  05/29/2020  Ms. Reist was observed post Covid-19 immunization for 15 minutes without incident. She was provided with Vaccine Information Sheet and instruction to access the V-Safe system.   Ms. Bertholf was instructed to call 911 with any severe reactions post vaccine: Marland Kitchen Difficulty breathing  . Swelling of face and throat  . A fast heartbeat  . A bad rash all over body  . Dizziness and weakness

## 2020-07-31 DIAGNOSIS — D225 Melanocytic nevi of trunk: Secondary | ICD-10-CM | POA: Diagnosis not present

## 2020-07-31 DIAGNOSIS — L821 Other seborrheic keratosis: Secondary | ICD-10-CM | POA: Diagnosis not present

## 2020-07-31 DIAGNOSIS — D2261 Melanocytic nevi of right upper limb, including shoulder: Secondary | ICD-10-CM | POA: Diagnosis not present

## 2020-07-31 DIAGNOSIS — D1801 Hemangioma of skin and subcutaneous tissue: Secondary | ICD-10-CM | POA: Diagnosis not present

## 2020-07-31 DIAGNOSIS — Z85828 Personal history of other malignant neoplasm of skin: Secondary | ICD-10-CM | POA: Diagnosis not present

## 2020-07-31 DIAGNOSIS — L814 Other melanin hyperpigmentation: Secondary | ICD-10-CM | POA: Diagnosis not present

## 2020-08-07 DIAGNOSIS — M720 Palmar fascial fibromatosis [Dupuytren]: Secondary | ICD-10-CM | POA: Diagnosis not present

## 2020-08-07 DIAGNOSIS — Z0001 Encounter for general adult medical examination with abnormal findings: Secondary | ICD-10-CM | POA: Diagnosis not present

## 2020-08-07 DIAGNOSIS — E559 Vitamin D deficiency, unspecified: Secondary | ICD-10-CM | POA: Diagnosis not present

## 2020-08-07 DIAGNOSIS — R432 Parageusia: Secondary | ICD-10-CM | POA: Diagnosis not present

## 2020-08-07 DIAGNOSIS — Z23 Encounter for immunization: Secondary | ICD-10-CM | POA: Diagnosis not present

## 2020-08-07 DIAGNOSIS — F411 Generalized anxiety disorder: Secondary | ICD-10-CM | POA: Diagnosis not present

## 2020-08-07 DIAGNOSIS — H6123 Impacted cerumen, bilateral: Secondary | ICD-10-CM | POA: Diagnosis not present

## 2020-08-07 DIAGNOSIS — R439 Unspecified disturbances of smell and taste: Secondary | ICD-10-CM | POA: Diagnosis not present

## 2020-08-07 DIAGNOSIS — K111 Hypertrophy of salivary gland: Secondary | ICD-10-CM | POA: Diagnosis not present

## 2020-08-07 DIAGNOSIS — E782 Mixed hyperlipidemia: Secondary | ICD-10-CM | POA: Diagnosis not present

## 2020-08-07 DIAGNOSIS — M810 Age-related osteoporosis without current pathological fracture: Secondary | ICD-10-CM | POA: Diagnosis not present

## 2020-08-21 DIAGNOSIS — Z23 Encounter for immunization: Secondary | ICD-10-CM | POA: Diagnosis not present

## 2020-09-04 IMAGING — DX DG CHEST 2V
2 series · 2 of 2 positions shown · non-contrast
Comparison: CT virtual colonoscopy March 10, 2008. CT scans of the
chest April 13, 2012 and October 14, 2012

CLINICAL DATA: Enlarged left submandibular gland.

EXAM:
CHEST - 2 VIEW

[dg chest 2 view (1 of 2)]
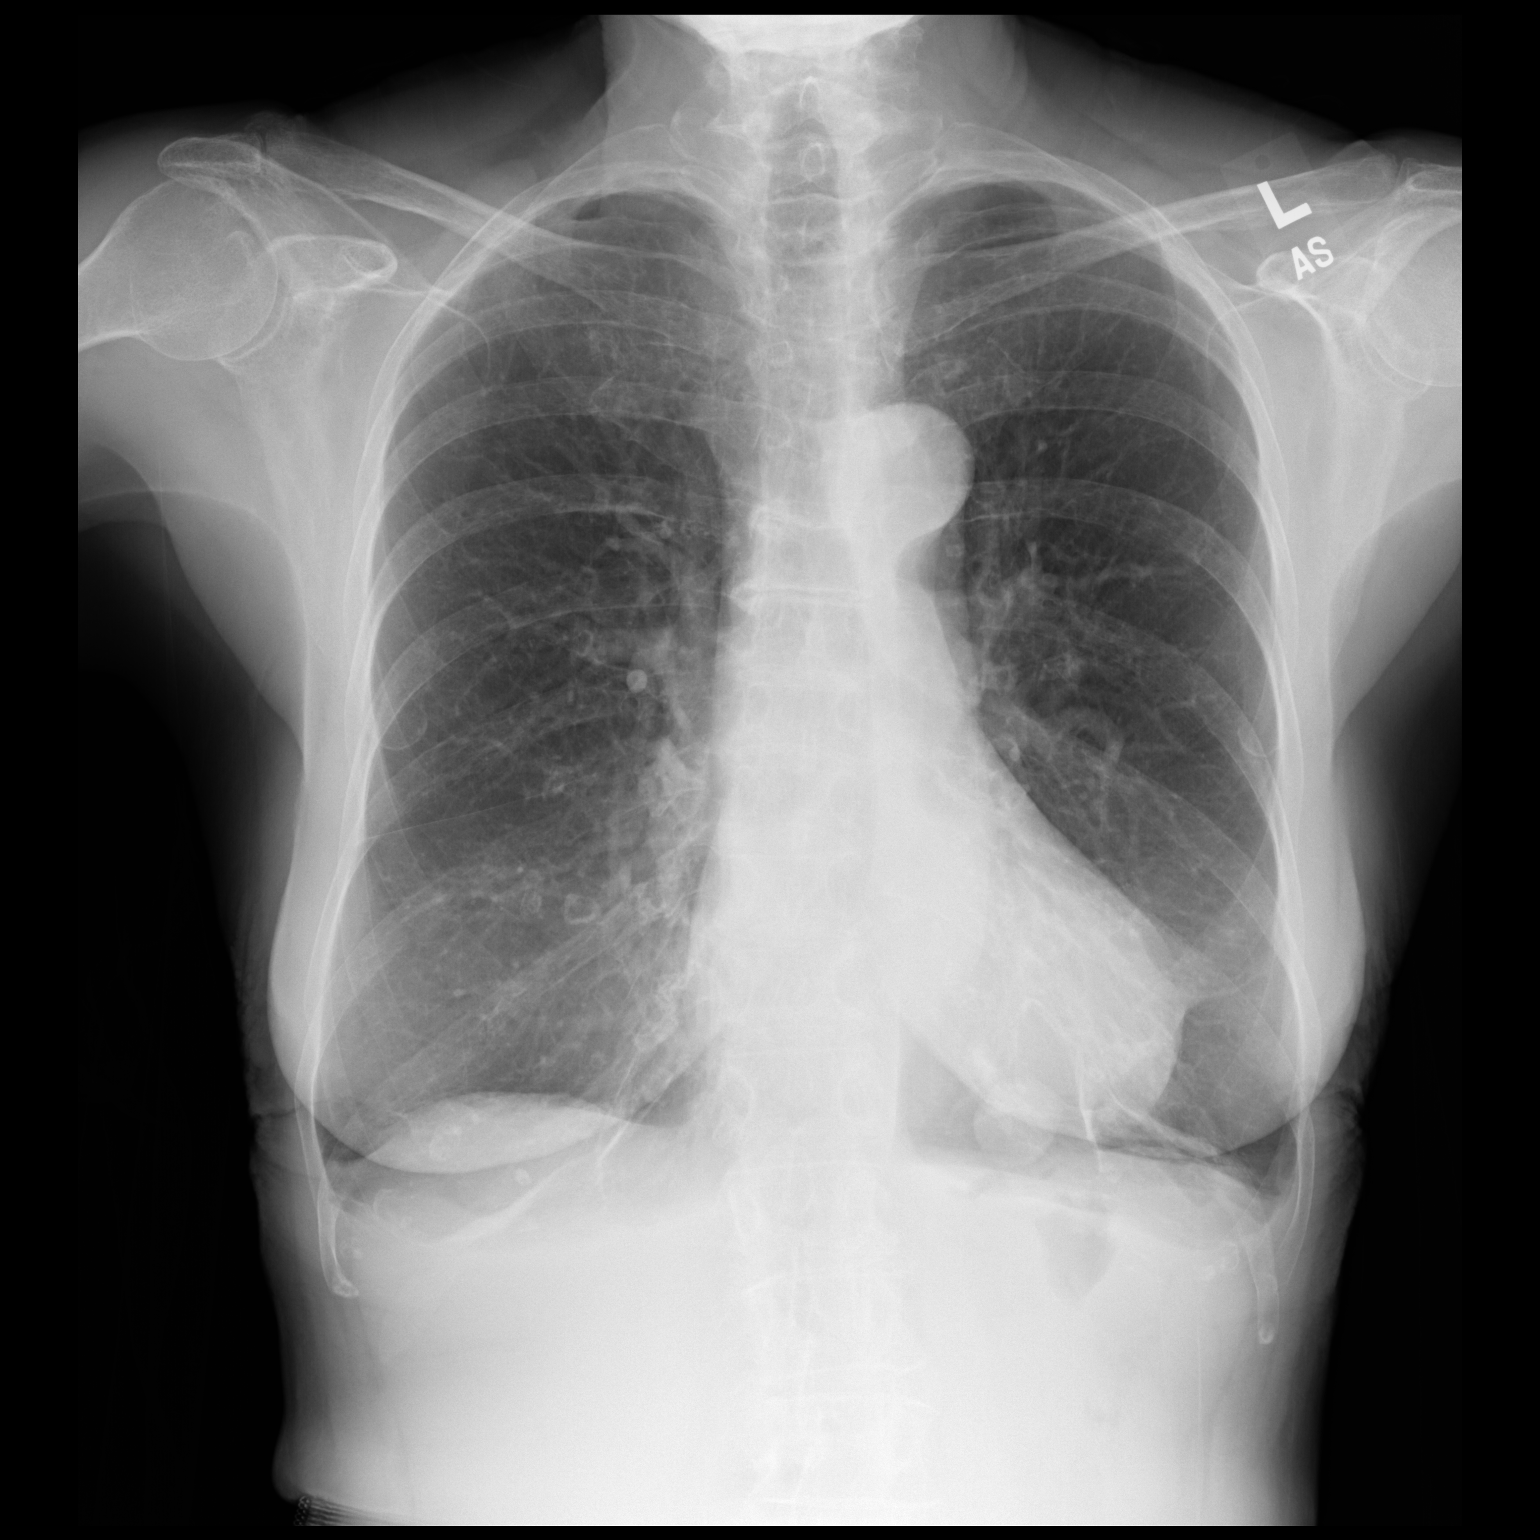

[dg chest 2 view (2 of 2)]
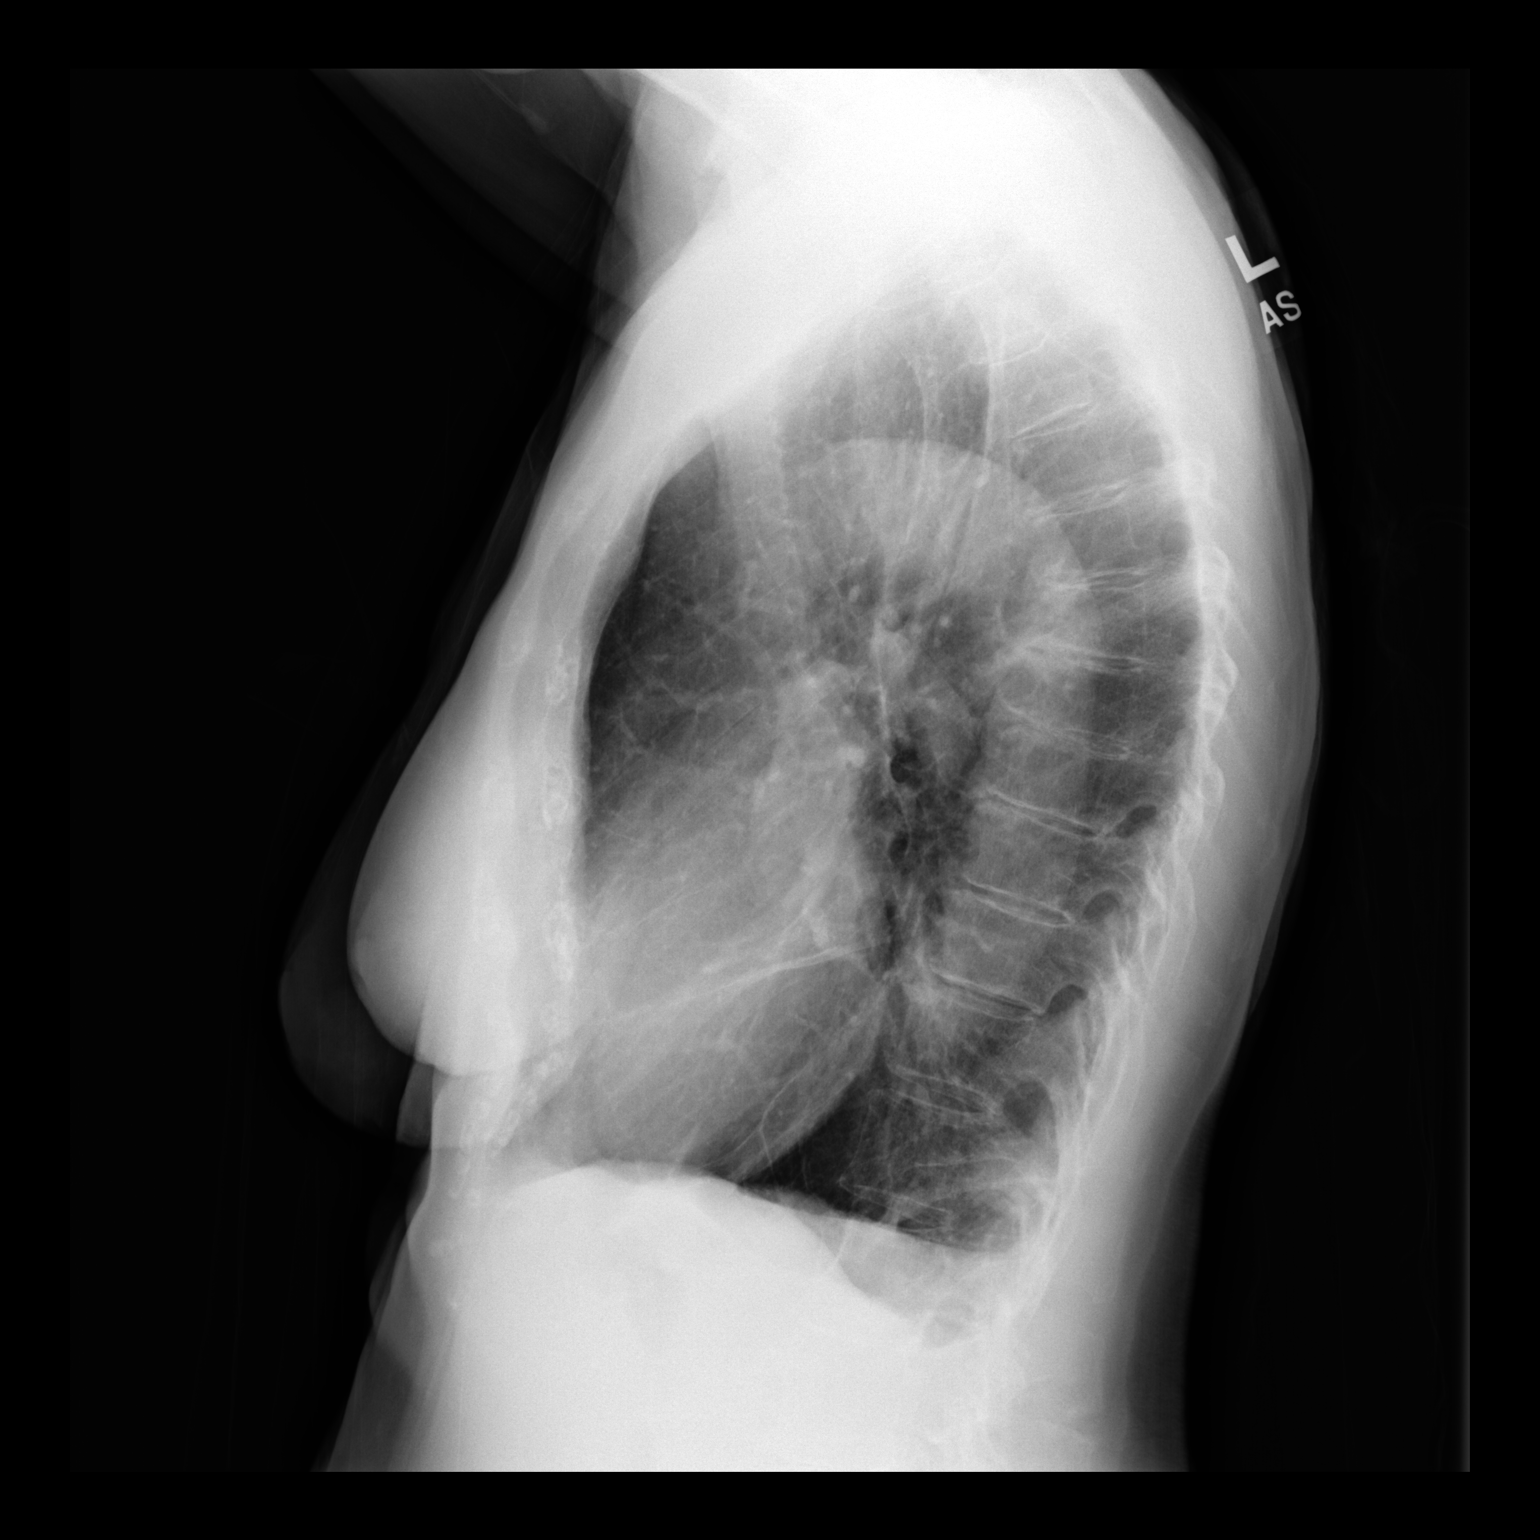

[2 of 2 positions shown; findings below may reference images not displayed]

FINDINGS: Again noted is a nodule in the left base measuring 2.5 by 2.2 cm
today versus 1.8 by 1.7 cm in Thursday March, 2012. This nodule has been
present since 9445. The heart, hila, and mediastinum are normal. No
pneumothorax. No other nodules or masses. Blunting of the left
costophrenic angle is stable since 7019. No other interval changes.
IMPRESSION: 1. The nodule in the left base has slowly grown since 9445. A slow
growing malignancy is not excluded. However, the slow rate of growth
would suggest a benign etiology.
2. No other suspicious findings.

These results will be called to the ordering clinician or
representative by the Radiologist Assistant, and communication
documented in the PACS or zVision Dashboard.

## 2021-01-07 DIAGNOSIS — Z1231 Encounter for screening mammogram for malignant neoplasm of breast: Secondary | ICD-10-CM | POA: Diagnosis not present

## 2021-05-02 DIAGNOSIS — K5900 Constipation, unspecified: Secondary | ICD-10-CM | POA: Diagnosis not present

## 2021-05-02 DIAGNOSIS — K625 Hemorrhage of anus and rectum: Secondary | ICD-10-CM | POA: Diagnosis not present

## 2021-05-29 DIAGNOSIS — Z961 Presence of intraocular lens: Secondary | ICD-10-CM | POA: Diagnosis not present

## 2021-06-05 DIAGNOSIS — K625 Hemorrhage of anus and rectum: Secondary | ICD-10-CM | POA: Diagnosis not present

## 2021-06-05 DIAGNOSIS — K5900 Constipation, unspecified: Secondary | ICD-10-CM | POA: Diagnosis not present

## 2021-08-02 DIAGNOSIS — D1801 Hemangioma of skin and subcutaneous tissue: Secondary | ICD-10-CM | POA: Diagnosis not present

## 2021-08-02 DIAGNOSIS — L309 Dermatitis, unspecified: Secondary | ICD-10-CM | POA: Diagnosis not present

## 2021-08-02 DIAGNOSIS — L820 Inflamed seborrheic keratosis: Secondary | ICD-10-CM | POA: Diagnosis not present

## 2021-08-02 DIAGNOSIS — L821 Other seborrheic keratosis: Secondary | ICD-10-CM | POA: Diagnosis not present

## 2021-08-02 DIAGNOSIS — D692 Other nonthrombocytopenic purpura: Secondary | ICD-10-CM | POA: Diagnosis not present

## 2021-08-13 DIAGNOSIS — H6120 Impacted cerumen, unspecified ear: Secondary | ICD-10-CM | POA: Diagnosis not present

## 2021-08-13 DIAGNOSIS — K5900 Constipation, unspecified: Secondary | ICD-10-CM | POA: Diagnosis not present

## 2021-08-13 DIAGNOSIS — E559 Vitamin D deficiency, unspecified: Secondary | ICD-10-CM | POA: Diagnosis not present

## 2021-08-13 DIAGNOSIS — Z23 Encounter for immunization: Secondary | ICD-10-CM | POA: Diagnosis not present

## 2021-08-13 DIAGNOSIS — M720 Palmar fascial fibromatosis [Dupuytren]: Secondary | ICD-10-CM | POA: Diagnosis not present

## 2021-08-13 DIAGNOSIS — M810 Age-related osteoporosis without current pathological fracture: Secondary | ICD-10-CM | POA: Diagnosis not present

## 2021-08-13 DIAGNOSIS — Z0001 Encounter for general adult medical examination with abnormal findings: Secondary | ICD-10-CM | POA: Diagnosis not present

## 2021-08-13 DIAGNOSIS — E782 Mixed hyperlipidemia: Secondary | ICD-10-CM | POA: Diagnosis not present

## 2021-08-13 DIAGNOSIS — K111 Hypertrophy of salivary gland: Secondary | ICD-10-CM | POA: Diagnosis not present

## 2021-08-13 DIAGNOSIS — R439 Unspecified disturbances of smell and taste: Secondary | ICD-10-CM | POA: Diagnosis not present

## 2021-08-13 DIAGNOSIS — R432 Parageusia: Secondary | ICD-10-CM | POA: Diagnosis not present

## 2022-01-16 DIAGNOSIS — H5203 Hypermetropia, bilateral: Secondary | ICD-10-CM | POA: Diagnosis not present

## 2022-01-16 DIAGNOSIS — H04123 Dry eye syndrome of bilateral lacrimal glands: Secondary | ICD-10-CM | POA: Diagnosis not present

## 2022-01-16 DIAGNOSIS — Z961 Presence of intraocular lens: Secondary | ICD-10-CM | POA: Diagnosis not present

## 2022-01-16 DIAGNOSIS — H43393 Other vitreous opacities, bilateral: Secondary | ICD-10-CM | POA: Diagnosis not present

## 2022-01-16 DIAGNOSIS — H52203 Unspecified astigmatism, bilateral: Secondary | ICD-10-CM | POA: Diagnosis not present

## 2022-01-16 DIAGNOSIS — H524 Presbyopia: Secondary | ICD-10-CM | POA: Diagnosis not present

## 2022-01-21 DIAGNOSIS — R5383 Other fatigue: Secondary | ICD-10-CM | POA: Diagnosis not present

## 2022-01-21 DIAGNOSIS — R509 Fever, unspecified: Secondary | ICD-10-CM | POA: Diagnosis not present

## 2022-01-21 DIAGNOSIS — U071 COVID-19: Secondary | ICD-10-CM | POA: Diagnosis not present

## 2022-01-28 DIAGNOSIS — Z1231 Encounter for screening mammogram for malignant neoplasm of breast: Secondary | ICD-10-CM | POA: Diagnosis not present

## 2022-02-26 DIAGNOSIS — M810 Age-related osteoporosis without current pathological fracture: Secondary | ICD-10-CM | POA: Diagnosis not present

## 2022-03-25 DIAGNOSIS — M7661 Achilles tendinitis, right leg: Secondary | ICD-10-CM | POA: Diagnosis not present

## 2022-05-03 DIAGNOSIS — M25562 Pain in left knee: Secondary | ICD-10-CM | POA: Diagnosis not present

## 2022-05-03 DIAGNOSIS — L03818 Cellulitis of other sites: Secondary | ICD-10-CM | POA: Diagnosis not present

## 2022-05-06 DIAGNOSIS — M7989 Other specified soft tissue disorders: Secondary | ICD-10-CM | POA: Diagnosis not present

## 2022-05-07 DIAGNOSIS — M7989 Other specified soft tissue disorders: Secondary | ICD-10-CM | POA: Diagnosis not present

## 2022-05-07 DIAGNOSIS — R600 Localized edema: Secondary | ICD-10-CM | POA: Diagnosis not present

## 2022-05-19 DIAGNOSIS — L821 Other seborrheic keratosis: Secondary | ICD-10-CM | POA: Diagnosis not present

## 2022-05-30 DIAGNOSIS — L570 Actinic keratosis: Secondary | ICD-10-CM | POA: Diagnosis not present

## 2022-05-30 DIAGNOSIS — L821 Other seborrheic keratosis: Secondary | ICD-10-CM | POA: Diagnosis not present

## 2022-05-30 DIAGNOSIS — B078 Other viral warts: Secondary | ICD-10-CM | POA: Diagnosis not present

## 2022-07-12 DIAGNOSIS — Z23 Encounter for immunization: Secondary | ICD-10-CM | POA: Diagnosis not present

## 2022-09-03 DIAGNOSIS — H6123 Impacted cerumen, bilateral: Secondary | ICD-10-CM | POA: Diagnosis not present

## 2022-09-03 DIAGNOSIS — M810 Age-related osteoporosis without current pathological fracture: Secondary | ICD-10-CM | POA: Diagnosis not present

## 2022-09-03 DIAGNOSIS — E559 Vitamin D deficiency, unspecified: Secondary | ICD-10-CM | POA: Diagnosis not present

## 2022-09-03 DIAGNOSIS — K5900 Constipation, unspecified: Secondary | ICD-10-CM | POA: Diagnosis not present

## 2022-09-03 DIAGNOSIS — Z0000 Encounter for general adult medical examination without abnormal findings: Secondary | ICD-10-CM | POA: Diagnosis not present

## 2022-09-03 DIAGNOSIS — M6530 Trigger finger, unspecified finger: Secondary | ICD-10-CM | POA: Diagnosis not present

## 2022-10-20 ENCOUNTER — Ambulatory Visit
Admission: RE | Admit: 2022-10-20 | Discharge: 2022-10-20 | Disposition: A | Discharge status: Home / Self Care | Payer: Medicare Other | Source: Ambulatory Visit | Attending: Internal Medicine | Admitting: Internal Medicine

## 2022-10-20 ENCOUNTER — Other Ambulatory Visit: Payer: Self-pay | Admitting: Internal Medicine

## 2022-10-20 DIAGNOSIS — M25552 Pain in left hip: Secondary | ICD-10-CM

## 2022-11-18 DIAGNOSIS — R6889 Other general symptoms and signs: Secondary | ICD-10-CM | POA: Diagnosis not present

## 2023-03-03 DIAGNOSIS — Z1231 Encounter for screening mammogram for malignant neoplasm of breast: Secondary | ICD-10-CM | POA: Diagnosis not present

## 2023-05-27 DIAGNOSIS — M2569 Stiffness of other specified joint, not elsewhere classified: Secondary | ICD-10-CM | POA: Diagnosis not present

## 2023-07-18 DIAGNOSIS — Z23 Encounter for immunization: Secondary | ICD-10-CM | POA: Diagnosis not present

## 2023-09-09 DIAGNOSIS — E559 Vitamin D deficiency, unspecified: Secondary | ICD-10-CM | POA: Diagnosis not present

## 2023-09-09 DIAGNOSIS — M810 Age-related osteoporosis without current pathological fracture: Secondary | ICD-10-CM | POA: Diagnosis not present

## 2023-09-09 DIAGNOSIS — Z1331 Encounter for screening for depression: Secondary | ICD-10-CM | POA: Diagnosis not present

## 2023-09-09 DIAGNOSIS — Z0000 Encounter for general adult medical examination without abnormal findings: Secondary | ICD-10-CM | POA: Diagnosis not present

## 2023-12-31 DIAGNOSIS — M5459 Other low back pain: Secondary | ICD-10-CM | POA: Diagnosis not present

## 2024-03-29 DIAGNOSIS — Z1231 Encounter for screening mammogram for malignant neoplasm of breast: Secondary | ICD-10-CM | POA: Diagnosis not present

## 2024-04-27 DIAGNOSIS — Z961 Presence of intraocular lens: Secondary | ICD-10-CM | POA: Diagnosis not present

## 2024-04-28 DIAGNOSIS — M810 Age-related osteoporosis without current pathological fracture: Secondary | ICD-10-CM | POA: Diagnosis not present

## 2024-04-29 DIAGNOSIS — M545 Low back pain, unspecified: Secondary | ICD-10-CM | POA: Diagnosis not present

## 2024-05-13 DIAGNOSIS — H6123 Impacted cerumen, bilateral: Secondary | ICD-10-CM | POA: Diagnosis not present

## 2024-05-25 ENCOUNTER — Institutional Professional Consult (permissible substitution) (INDEPENDENT_AMBULATORY_CARE_PROVIDER_SITE_OTHER): Admitting: Physician Assistant

## 2024-05-29 DIAGNOSIS — M810 Age-related osteoporosis without current pathological fracture: Secondary | ICD-10-CM | POA: Diagnosis not present

## 2024-06-01 ENCOUNTER — Institutional Professional Consult (permissible substitution) (INDEPENDENT_AMBULATORY_CARE_PROVIDER_SITE_OTHER): Admitting: Physician Assistant

## 2024-06-03 ENCOUNTER — Institutional Professional Consult (permissible substitution) (INDEPENDENT_AMBULATORY_CARE_PROVIDER_SITE_OTHER): Admitting: Physician Assistant

## 2024-06-03 DIAGNOSIS — H6123 Impacted cerumen, bilateral: Secondary | ICD-10-CM | POA: Diagnosis not present

## 2024-06-08 ENCOUNTER — Institutional Professional Consult (permissible substitution) (INDEPENDENT_AMBULATORY_CARE_PROVIDER_SITE_OTHER): Admitting: Physician Assistant

## 2024-06-28 DIAGNOSIS — M810 Age-related osteoporosis without current pathological fracture: Secondary | ICD-10-CM | POA: Diagnosis not present

## 2024-07-02 DIAGNOSIS — Z23 Encounter for immunization: Secondary | ICD-10-CM | POA: Diagnosis not present

## 2024-08-24 ENCOUNTER — Other Ambulatory Visit: Payer: Self-pay

## 2024-08-24 ENCOUNTER — Ambulatory Visit

## 2024-08-24 VITALS — BP 110/82 | Ht 63.0 in | Wt 106.0 lb

## 2024-08-24 DIAGNOSIS — S46211A Strain of muscle, fascia and tendon of other parts of biceps, right arm, initial encounter: Secondary | ICD-10-CM | POA: Diagnosis not present

## 2024-08-24 DIAGNOSIS — M79621 Pain in right upper arm: Secondary | ICD-10-CM

## 2024-08-24 NOTE — Progress Notes (Signed)
  PCP: Dwight Trula SQUIBB, MD  Subjective:   HPI: Patient is a 89 y.o. female here for swelling and bruising of her right arm.  Patient states that around 2 weeks ago she was reaching up towards the TV and felt a sudden pain into her right shoulder.  Soon after that she noticed bruising towards her elbow on the right.  She states that she sometimes has some pain but overall it feels okay.  She denies any tingling or numbness.  She denies any fall or other injury.  When asked where her pain is at, she points to the anterior and lateral portion of her shoulder.  She hardly takes any medications but is not needed to take any extra medications for pain.  Her son is present in the room with her and does state that overall she is felt more tired since the event and is unsure why she has had new fatigue.  No past medical history on file.  Current Outpatient Medications on File Prior to Visit  Medication Sig Dispense Refill   diclofenac  sodium (VOLTAREN ) 1 % GEL Apply 2 g topically 4 (four) times daily. 100 g 1   No current facility-administered medications on file prior to visit.    BP 110/82   Ht 5' 3 (1.6 m)   Wt 106 lb (48.1 kg)   BMI 18.78 kg/m        Objective:   Physical Exam:  Gen: NAD, comfortable in exam room Right shoulder/arm Inspection: Noticeable ecchymosis present at the cubital fossa on the right, as well as a Popeye deformity of the biceps, no warmth Palpation: Mild tenderness to palpation over the anterior and lateral shoulder ROM: Patient has full flexion 180 degrees Special Tests: Negative empty can, negative external rotation and resistance, some pain with Yergason's and speeds, patient strength with elbow flexion, elbow extension is equal to the contralateral side Neuro: Sensation is strength is maintained in the bilateral upper extremities  Limited ultrasound of the right shoulder: - Large full-thickness tear of the proximal biceps tendon - Hypoechoic change present  to the proximal tendon region - Rotator cuff was evaluated and does show signs of degenerative tearing of multiple tendons - AC joint arthritis present     Images saved to PACS.  Interpreted by Krystal Lowing, DO   Assessment/Plan:   Elfriede W Fjeld is a 89 y.o. female who was seen today for the following: 1. Pain in right upper arm (Primary) 2. Rupture of right proximal biceps tendon, initial encounter 2. Rupture of right proximal biceps tendon, initial encounter - US  LIMITED JOINT SPACE STRUCTURES UP RIGHT; Future - Patient physical exam and ultrasound significant for tearing of the proximal biceps tendon - Discussed that with her age, and this tear, with preserved strength that she will heal from this with time - Patient can take Tylenol for pain - reassured her and some that she will be able to return to her normal activities - Offered PT referral but son declined - She works with a psychologist, educational who will likely help her with some therapy - In terms of her new fatigue, recommend visiting with her PCP - Follow-up as needed    Follow-up/Education:   No follow-ups on file.   May return sooner as needed and encouraged to call/e-mail for additional questions or  worsening symptoms in the interim.  Krystal Lowing, DO Sports Medicine Fellow 08/24/2024 12:31 PM

## 2024-08-24 NOTE — Patient Instructions (Signed)
 What is the biceps tendon and how does it tear? The biceps tendon is attached to the biceps muscle which is the big muscle on the front of your arm (Figure 1). The biceps muscle is a big muscle which becomes slender and then turns into tendon at the shoulder and at the elbow. At the elbow it attaches on to one of the forearm bones, so when the muscle contracts it pulls the arm up (or flexes the elbow). The main function of the biceps muscle is to flex the elbow, and it is used every time you bend your elbow for activities such as lifting, eating or reaching behind your head.  The biceps muscle also turns into a thin tendon about the size of a pencil as it gets close to the shoulder joint (Figure 1). It runs in a groove in the arm bone (the humerus) near the shoulder, then goes through a gap between the rotator cuff tendons (see Patient Guide to Rotator Cuff Tendinitis). From there it goes into the joint where it attaches to the top of the socket and a specialized cartilage called the labrum (see Patient Guide to Labrum Tears). It is not known exactly why the biceps tendon at the shoulder begins to wear and eventually tear. For some reason the tendon over time begins to saw through like a rope, until it eventually breaks. Oftentimes the individual did not know there was a problem with the tendon until it pops or tears. It usually tears near the place where it enters the joint between the rotator cuff tendons or it tears inside the joint. The biceps tendon usually tears when you are using the arm stressfully, such as picking something up or pulling hard on some object.  What are the symptoms of a biceps tendon tear? Typically the arm becomes tender and sore in the front where the biceps muscle is located. When the tendon tears, because there is tension on it, it will recoil down into the upper half of the arm. Where it ends up is often sore and tender for up to 7 to 14 days. Since the biceps  muscle no longer has any tension on it, the muscle balls up in the upper half of the muscle. This area of swelling can be easily seen and felt in the arm, and it sometimes is tender. This swelling is called Popeye arm since it makes the muscle look suddenly bigger. Occasionally there will be tenderness or pain at the shoulder where the tendon has torn. Also, sometimes there may be some bruising along the front of the arm where the torn tendon has bled a little bit. This usually goes away over a couple weeks.  What is the treatment for biceps tendon tears at the shoulder? The initial treatment is to try to diminish any pain and swelling in the arm. This is best done by putting ice on the area of tenderness for 20 to 30 minutes several times a day (see Patient Guide to Icing Techniques). Also, medicines such as acetominophen (tylenol) or aspirin like medicines (called non-steroidal anti-inflammatory drugs) such as ibuprofen, naproxen, Aleve, Advil, Voltaren , Celebrex, Vioxx, among others. These medicines should be taken for as long as the soreness persists, which is usually a couple weeks. It is important for your elbow and shoulder to not get stiff, so range of motion and stretching of the elbow and shoulder should begin within a day or so of the tear. It is important to avoid getting a stiff shoulder (  see Patient Guide to Frozen Shoulder), so you should stretch at least once a day. If you feel your shoulder getting stiff you should consult your physician.  What is the recovery time for biceps tendon ruptures? Most of the time the pain and swelling subside within a couple weeks, and sometimes sooner. It is recommended that you return to activity as the pain allows, and return to full activity usually takes two to four weeks. There may be residual soreness in the biceps muscle for a longer period of time (up to six weeks) but it should subside over time. There may also be some soreness in the  muscle as you return to activity and that should be expected. You should not be experiencing stiffness and inability to use the arm for longer than a few weeks. If you are having problems you should contact your physician. Most people recover full strength of the shoulder and arm with no limitations. Sometimes physical therapy is indicated to have the therapist demonstrate some stretching or strengthening techniques, but a majority of biceps tendon ruptures do not need therapy.
